# Patient Record
Sex: Female | Born: 1980 | Race: White | Hispanic: No | Marital: Married | Smoking: Never smoker
Health system: Southern US, Community
[De-identification: ages and names within clinical notes are randomized; demographics above are authoritative.]

## PROBLEM LIST (undated history)

## (undated) ENCOUNTER — Inpatient Hospital Stay (HOSPITAL_COMMUNITY): Payer: Self-pay

## (undated) DIAGNOSIS — Z8619 Personal history of other infectious and parasitic diseases: Secondary | ICD-10-CM

## (undated) DIAGNOSIS — B977 Papillomavirus as the cause of diseases classified elsewhere: Secondary | ICD-10-CM

## (undated) HISTORY — DX: Personal history of other infectious and parasitic diseases: Z86.19

## (undated) HISTORY — PX: COLPOSCOPY: SHX161

## (undated) HISTORY — PX: NO PAST SURGERIES: SHX2092

---

## 2012-09-19 ENCOUNTER — Other Ambulatory Visit (HOSPITAL_COMMUNITY)
Admission: RE | Admit: 2012-09-19 | Discharge: 2012-09-19 | Disposition: A | Discharge status: Home / Self Care | Payer: BC Managed Care – PPO | Source: Ambulatory Visit | Attending: Family Medicine | Admitting: Family Medicine

## 2012-09-19 DIAGNOSIS — Z1151 Encounter for screening for human papillomavirus (HPV): Secondary | ICD-10-CM | POA: Insufficient documentation

## 2012-09-19 DIAGNOSIS — Z124 Encounter for screening for malignant neoplasm of cervix: Secondary | ICD-10-CM | POA: Insufficient documentation

## 2012-10-02 ENCOUNTER — Inpatient Hospital Stay (HOSPITAL_COMMUNITY): Payer: BC Managed Care – PPO

## 2012-10-02 ENCOUNTER — Inpatient Hospital Stay (HOSPITAL_COMMUNITY)
Admission: AD | Admit: 2012-10-02 | Discharge: 2012-10-02 | Disposition: A | Discharge status: Home / Self Care | Payer: BC Managed Care – PPO | Source: Ambulatory Visit | Attending: Obstetrics & Gynecology | Admitting: Obstetrics & Gynecology

## 2012-10-02 ENCOUNTER — Encounter (HOSPITAL_COMMUNITY): Payer: Self-pay | Admitting: *Deleted

## 2012-10-02 DIAGNOSIS — O200 Threatened abortion: Secondary | ICD-10-CM | POA: Insufficient documentation

## 2012-10-02 DIAGNOSIS — R109 Unspecified abdominal pain: Secondary | ICD-10-CM | POA: Insufficient documentation

## 2012-10-02 LAB — URINALYSIS, ROUTINE W REFLEX MICROSCOPIC
Bilirubin Urine: NEGATIVE
Ketones, ur: NEGATIVE mg/dL
Nitrite: NEGATIVE
pH: 7 (ref 5.0–8.0)

## 2012-10-02 LAB — CBC
HCT: 41.4 % (ref 36.0–46.0)
Hemoglobin: 13.6 g/dL (ref 12.0–15.0)
MCH: 27.1 pg (ref 26.0–34.0)
MCV: 82.5 fL (ref 78.0–100.0)
Platelets: 192 10*3/uL (ref 150–400)
RBC: 5.02 MIL/uL (ref 3.87–5.11)

## 2012-10-02 LAB — WET PREP, GENITAL
Clue Cells Wet Prep HPF POC: NONE SEEN
Trich, Wet Prep: NONE SEEN
Yeast Wet Prep HPF POC: NONE SEEN

## 2012-10-02 NOTE — MAU Note (Signed)
Has been trying to get preg.  Period is 14days late.  Yesterday felt a little weird.  Yesterday had tannish d/c.  This morning woke up with very bad cramping. And now having bleeding, some clots when urinating.

## 2012-10-02 NOTE — MAU Provider Note (Signed)
History     CSN: [id number]  Arrival date and time: 10/02/12 1137   First Giovonni Poirier Initiated Contact with Patient 10/02/12 1313      Chief Complaint  Patient presents with  . Vaginal Bleeding  . Abdominal Pain  . Possible Pregnancy   HPI This is a 32 y.o. at 4+ weeks gestation who presents with c/o cramping and bleeding. Has been trying to conceive and this is a much-desired pregnancy. Bleeding started today.  OB History   Grav Para Term Preterm Abortions TAB SAB Ect Mult Living   1               No past medical history on file.  No past surgical history on file.  No family history on file.  History  Substance Use Topics  . Smoking status: Not on file  . Smokeless tobacco: Not on file  . Alcohol Use: Not on file    Allergies: Allergies not on file  No prescriptions prior to admission    Review of Systems  Constitutional: Negative for fever, chills and malaise/fatigue.  Gastrointestinal: Positive for abdominal pain. Negative for nausea, vomiting, diarrhea and constipation.  Genitourinary: Negative for dysuria.   Physical Exam   Blood pressure 107/64, pulse 92, temperature 98.1 F (36.7 C), temperature source Oral, resp. rate 18, height 5\' 8"  (1.727 m), weight 140 lb (63.504 kg), last menstrual period 08/21/2012.  Physical Exam  Constitutional: She is oriented to person, place, and time. She appears well-developed and well-nourished. No distress (but tearful).  Cardiovascular: Normal rate.   Respiratory: Effort normal.  GI: Soft. She exhibits no distension and no mass. There is tenderness (over uterus). There is no rebound and no guarding.  Genitourinary: Uterus normal. Vaginal discharge (moderate blood with clots) found.  Uterus small, 4 wk size, adnexae nontender   Musculoskeletal: Normal range of motion.  Neurological: She is alert and oriented to person, place, and time.  Skin: Skin is warm and dry.  Psychiatric: She has a normal mood and affect.    Results for orders placed during the hospital encounter of 10/02/12 (from the past 24 hour(s))  URINALYSIS, ROUTINE W REFLEX MICROSCOPIC     Status: Abnormal   Collection Time    10/02/12 12:21 PM      Result Value Range   Color, Urine YELLOW  YELLOW   APPearance CLEAR  CLEAR   Specific Gravity, Urine 1.010  1.005 - 1.030   pH 7.0  5.0 - 8.0   Glucose, UA NEGATIVE  NEGATIVE mg/dL   Hgb urine dipstick SMALL (*) NEGATIVE   Bilirubin Urine NEGATIVE  NEGATIVE   Ketones, ur NEGATIVE  NEGATIVE mg/dL   Protein, ur NEGATIVE  NEGATIVE mg/dL   Urobilinogen, UA 0.2  0.0 - 1.0 mg/dL   Nitrite NEGATIVE  NEGATIVE   Leukocytes, UA NEGATIVE  NEGATIVE  URINE MICROSCOPIC-ADD ON     Status: Abnormal   Collection Time    10/02/12 12:21 PM      Result Value Range   Squamous Epithelial / LPF FEW (*) RARE  POCT PREGNANCY, URINE     Status: Abnormal   Collection Time    10/02/12 12:30 PM      Result Value Range   Preg Test, Ur POSITIVE (*) NEGATIVE  CBC     Status: None   Collection Time    10/02/12 12:38 PM      Result Value Range   WBC 8.2  4.0 - 10.5 K/uL   RBC 5.02  3.87 - 5.11 MIL/uL   Hemoglobin 13.6  12.0 - 15.0 g/dL   HCT 40.9  81.1 - 91.4 %   MCV 82.5  78.0 - 100.0 fL   MCH 27.1  26.0 - 34.0 pg   MCHC 32.9  30.0 - 36.0 g/dL   RDW 78.2  95.6 - 21.3 %   Platelets 192  150 - 400 K/uL  HCG, QUANTITATIVE, PREGNANCY     Status: Abnormal   Collection Time    10/02/12 12:38 PM      Result Value Range   hCG, Beta Chain, Quant, S 232 (*) <5 mIU/mL  WET PREP, GENITAL     Status: Abnormal   Collection Time    10/02/12  1:20 PM      Result Value Range   Yeast Wet Prep HPF POC NONE SEEN  NONE SEEN   Trich, Wet Prep NONE SEEN  NONE SEEN   Clue Cells Wet Prep HPF POC NONE SEEN  NONE SEEN   WBC, Wet Prep HPF POC FEW (*) NONE SEEN    MAU Course  Procedures  MDM Quant, cultures and [place] ordered  Assessment and Plan  A:  SIUP at [redacted]w[redacted]d by LMP, 5 weeks by gestational sac      Threatened  Abortion, yolk sac seen  P:  Discharge home       Repeat quant in 48 hrs then [place] in a week     I told her she can leave after her blood draw Friday and we will call her with results  [facility] 10/02/2012, 1:26 PM

## 2012-10-02 NOTE — MAU Note (Signed)
Name and DOB verified. Pt confirmed spelling on IDband is correct.

## 2012-10-03 LAB — GC/CHLAMYDIA PROBE AMP
CT Probe RNA: NEGATIVE
GC Probe RNA: NEGATIVE

## 2012-10-03 NOTE — MAU Provider Note (Signed)

## 2012-10-05 ENCOUNTER — Inpatient Hospital Stay (HOSPITAL_COMMUNITY)
Admission: AD | Admit: 2012-10-05 | Discharge: 2012-10-05 | Disposition: A | Discharge status: Home / Self Care | Payer: BC Managed Care – PPO | Source: Ambulatory Visit | Attending: Obstetrics & Gynecology | Admitting: Obstetrics & Gynecology

## 2012-10-05 DIAGNOSIS — O021 Missed abortion: Secondary | ICD-10-CM | POA: Insufficient documentation

## 2012-10-05 NOTE — MAU Note (Signed)
Pt presents for repeat BHCG level.

## 2012-10-05 NOTE — MAU Provider Note (Signed)
  History     CSN: [id number]  Arrival date and time: 10/05/12 1810   None     No chief complaint on file.  HPI  Pt here for f/u Beta HCG.  Pt had Beta HCG of 232 on 3/5 when she presented with bleeding in pregnancy.  Pt states that she is still bleeding and had bled more the day after she was here with a clot.  Pt is not having pain today.  Pt was told she could have her HCG drawn and could leave and we would call her with results  No past medical history on file.  No past surgical history on file.  No family history on file.  History  Substance Use Topics  . Smoking status: Never Smoker   . Smokeless tobacco: Never Used  . Alcohol Use: No    Allergies: No Known Allergies  Prescriptions prior to admission  Medication Sig Dispense Refill  . Prenatal Vit-Fe Fumarate-FA (PRENATAL MULTIVITAMIN) TABS Take 1 tablet by mouth daily at 12 noon.        ROS Physical Exam   Last menstrual period 08/21/2012.  Physical Exam  Vitals reviewed. Constitutional: She is oriented to person, place, and time. She appears well-developed and well-nourished. No distress.  HENT:  Head: Normocephalic.  Eyes: Pupils are equal, round, and reactive to light.  Neck: Normal range of motion. Neck supple.  Cardiovascular: Normal rate.   Respiratory: Effort normal.  Musculoskeletal: Normal range of motion.  Neurological: She is alert and oriented to person, place, and time.  Skin: Skin is warm and dry.  Psychiatric: She has a normal mood and affect.    MAU Course  Procedures Results for orders placed during the hospital encounter of 10/05/12 (from the past 24 hour(s))  HCG, QUANTITATIVE, PREGNANCY     Status: Abnormal   Collection Time    10/05/12  6:23 PM      Result Value Range   hCG, Beta Chain, Quant, S 25 (*) <5 mIU/mL    Assessment and Plan  Anembryonic pregnancy Results reported to pt by phone Will f/u in 1 week for repeat [facility] and discuss failed pregnancy and recommendation  for future pregnancy- advised to wait 2 to 3 cycles  LINEBERRY,SUSAN 10/05/2012, 6:24 PM

## 2012-10-07 NOTE — MAU Provider Note (Signed)
Attestation of Attending Supervision of Advanced Practitioner (CNM/NP): Evaluation and management procedures were performed by the Advanced Practitioner under my supervision and collaboration.  I have reviewed the Advanced Practitioner's note and chart, and I agree with the management and plan.  Tecla Mailloux 10/07/2012 1:20 PM

## 2012-10-12 ENCOUNTER — Encounter (HOSPITAL_COMMUNITY): Payer: Self-pay | Admitting: Advanced Practice Midwife

## 2012-10-12 ENCOUNTER — Inpatient Hospital Stay (HOSPITAL_COMMUNITY)
Admission: AD | Admit: 2012-10-12 | Discharge: 2012-10-12 | Disposition: A | Discharge status: Home / Self Care | Payer: BC Managed Care – PPO | Source: Ambulatory Visit | Attending: Family Medicine | Admitting: Family Medicine

## 2012-10-12 DIAGNOSIS — O039 Complete or unspecified spontaneous abortion without complication: Secondary | ICD-10-CM | POA: Insufficient documentation

## 2012-10-12 NOTE — MAU Note (Signed)
Ms. Komar is here for repeat HCG. She is requesting to leave and be called with the results.

## 2012-10-12 NOTE — MAU Provider Note (Signed)
Christine Moyer is a 32 y.o. G2P1011 here for f/u quant s/p SAB. Seen in MAU on 3/6 with + UPT and bleeding, quant 232 at that time, repeat quant 2 days later was 25. Today has no bleeding or pain, states she thinks she passed a fetus in a sac on Monday.   Results for orders placed during the hospital encounter of 10/12/12 (from the past 24 hour(s))  HCG, QUANTITATIVE, PREGNANCY     Status: None   Collection Time    10/12/12 11:18 AM      Result Value Range   hCG, Beta Chain, Quant, S 2  <5 mIU/mL   A/P: Complete SAB Answered questions about when to resume efforts to conceive - recommended waiting at least one full menstrual cycle Follow up PRN

## 2012-10-12 NOTE — MAU Note (Addendum)
Pt is certain that she passed a "fetus" on this past Monday. She is certain that it was in a sack. Pt had some questions about when she could try and conceive again.

## 2012-10-13 NOTE — MAU Provider Note (Signed)
Chart reviewed and agree with management and plan.  

## 2013-04-15 ENCOUNTER — Inpatient Hospital Stay (HOSPITAL_COMMUNITY): Payer: BC Managed Care – PPO

## 2013-04-15 ENCOUNTER — Inpatient Hospital Stay (HOSPITAL_COMMUNITY)
Admission: AD | Admit: 2013-04-15 | Discharge: 2013-04-15 | Disposition: A | Discharge status: Home / Self Care | Payer: BC Managed Care – PPO | Source: Ambulatory Visit | Attending: Obstetrics & Gynecology | Admitting: Obstetrics & Gynecology

## 2013-04-15 ENCOUNTER — Encounter (HOSPITAL_COMMUNITY): Payer: Self-pay | Admitting: *Deleted

## 2013-04-15 DIAGNOSIS — O209 Hemorrhage in early pregnancy, unspecified: Secondary | ICD-10-CM | POA: Insufficient documentation

## 2013-04-15 DIAGNOSIS — N949 Unspecified condition associated with female genital organs and menstrual cycle: Secondary | ICD-10-CM | POA: Insufficient documentation

## 2013-04-15 DIAGNOSIS — R109 Unspecified abdominal pain: Secondary | ICD-10-CM | POA: Insufficient documentation

## 2013-04-15 DIAGNOSIS — Z3201 Encounter for pregnancy test, result positive: Secondary | ICD-10-CM

## 2013-04-15 LAB — URINALYSIS, ROUTINE W REFLEX MICROSCOPIC
Bilirubin Urine: NEGATIVE
Glucose, UA: NEGATIVE mg/dL
Ketones, ur: NEGATIVE mg/dL
Specific Gravity, Urine: <1.005 — ABNORMAL LOW (ref 1.005–1.030)
pH: 7 (ref 5.0–8.0)

## 2013-04-15 LAB — URINE MICROSCOPIC-ADD ON

## 2013-04-15 LAB — HCG, QUANTITATIVE, PREGNANCY: hCG, Beta Chain, Quant, S: 5078 m[IU]/mL — ABNORMAL HIGH (ref <5)

## 2013-04-15 LAB — CBC
Hemoglobin: 13 g/dL (ref 12.0–15.0)
MCHC: 33.4 g/dL (ref 30.0–36.0)
Platelets: 172 10*3/uL (ref 150–400)
RBC: 4.67 MIL/uL (ref 3.87–5.11)

## 2013-04-15 NOTE — MAU Provider Note (Signed)
History     CSN: [id number]  Arrival date and time: 04/15/13 4010   First Provider Initiated Contact with Patient 04/15/13 1231      Chief Complaint  Patient presents with  . Vaginal Bleeding  . Abdominal Pain   HPI Christine Moyer is a G3P1011 @ 5 weeks by LMP patient presenting with vaginal bleeding and pelvic pain. Her bleeding began on 04/11/13 and has persisted thru today. It is less than a period, but includes occasional clots. Her pelvic pain began this AM ([id number]) and was described as "crampy" similar to prior pregnancy cramps. She has had no PNC yet, denies trauma and recent illness.  She has hx of miscarriage in March 2014.   Past Medical History  Diagnosis Date  . Medical history non-contributory     Past Surgical History  Procedure Laterality Date  . No past surgeries      History reviewed. No pertinent family history.  History  Substance Use Topics  . Smoking status: Never Smoker   . Smokeless tobacco: Never Used  . Alcohol Use: No    Allergies: No Known Allergies  No prescriptions prior to admission    Review of Systems  Constitutional: Negative for fever, chills and malaise/fatigue.  Gastrointestinal: Positive for abdominal pain (pelvic "crampy" pain). Negative for nausea, vomiting, diarrhea and constipation.  Genitourinary: Negative for dysuria.       Denies vaginal D/C  Musculoskeletal: Negative for back pain.  Neurological: Positive for dizziness (very minor dizzyness, attributed to not eating much).   Physical Exam   Blood pressure 102/67, pulse 78, temperature 98.3 F (36.8 C), temperature source Oral, resp. rate 18, weight 66.316 kg (146 lb 3.2 oz), last menstrual period 01/25/2013, unknown if currently breastfeeding.  Physical Exam  Constitutional: She appears well-developed and well-nourished. No distress.  HENT:  Head: Normocephalic and atraumatic.  Eyes: Conjunctivae are normal.  Cardiovascular: Normal rate, regular rhythm, normal  heart sounds and intact distal pulses.  Exam reveals no gallop and no friction rub.   No murmur heard. GI: Soft. Bowel sounds are normal. She exhibits no distension. There is tenderness (Minor TTP in LLQ).  Skin: Skin is warm and dry. She is not diaphoretic.  Pelvic exam declined today  MAU Course  Procedures Results for orders placed during the hospital encounter of 04/15/13 (from the past 24 hour(s))  CBC     Status: None   Collection Time    04/15/13 12:41 PM      Result Value Range   WBC 10.4  4.0 - 10.5 K/uL   RBC 4.67  3.87 - 5.11 MIL/uL   Hemoglobin 13.0  12.0 - 15.0 g/dL   HCT 40.3  47.4 - 25.9 %   MCV 83.3  78.0 - 100.0 fL   MCH 27.8  26.0 - 34.0 pg   MCHC 33.4  30.0 - 36.0 g/dL   RDW 56.3  87.5 - 64.3 %   Platelets 172  150 - 400 K/uL  HCG, QUANTITATIVE, PREGNANCY     Status: Abnormal   Collection Time    04/15/13 12:41 PM      Result Value Range   hCG, Beta Chain, Quant, S 5078 (*) <5 mIU/mL   US Ob Comp Less 14 Wks  04/15/2013   CLINICAL DATA:  Pregnant, bleeding, beta HCG pending  EXAM: OBSTETRIC <14 WK [place] AND TRANSVAGINAL OB US  TECHNIQUE: Both transabdominal and transvaginal ultrasound examinations were performed for complete evaluation of the gestation as well as the maternal  uterus, adnexal regions, and pelvic cul-de-sac. Transvaginal technique was performed to assess early pregnancy.  COMPARISON:  None.  FINDINGS: Intrauterine gestational sac:  Not visualized  Maternal uterus/adnexae: Endometrium is thickened/heterogeneous, measuring 2.6 cm in thickness.  Right ovary are within normal limits, measuring 2.4 x 1.8 x 2.1 cm.  Left ovary is only visualized transabdominally but is grossly unremarkable, measuring 2.6 x 1.5 x 1.5 cm.  Fluid/irregular cystic lesion within the vagina.  Trace free fluid.  IMPRESSION: No intrauterine gestation is visualized. No adnexal mass is seen. Correlate with beta HCG (pending).  Endometrial complex is thickened/heterogeneous, measuring 2.6  cm.  Fluid/irregular cystic lesion within the vagina. Clinical correlation required.   Electronically Signed   By: Charline Bills M.D.   On: 04/15/2013 14:01   US Ob Transvaginal  04/15/2013   CLINICAL DATA:  Pregnant, bleeding, beta HCG pending  EXAM: OBSTETRIC <14 WK [place] AND TRANSVAGINAL OB US  TECHNIQUE: Both transabdominal and transvaginal ultrasound examinations were performed for complete evaluation of the gestation as well as the maternal uterus, adnexal regions, and pelvic cul-de-sac. Transvaginal technique was performed to assess early pregnancy.  COMPARISON:  None.  FINDINGS: Intrauterine gestational sac:  Not visualized  Maternal uterus/adnexae: Endometrium is thickened/heterogeneous, measuring 2.6 cm in thickness.  Right ovary are within normal limits, measuring 2.4 x 1.8 x 2.1 cm.  Left ovary is only visualized transabdominally but is grossly unremarkable, measuring 2.6 x 1.5 x 1.5 cm.  Fluid/irregular cystic lesion within the vagina.  Trace free fluid.  IMPRESSION: No intrauterine gestation is visualized. No adnexal mass is seen. Correlate with beta HCG (pending).  Endometrial complex is thickened/heterogeneous, measuring 2.6 cm.  Fluid/irregular cystic lesion within the vagina. Clinical correlation required.   Electronically Signed   By: Charline Bills M.D.   On: 04/15/2013 14:01    MDM HCG pending. [place] doesn't show IUP or Ectopic. Considering estimated GA of [redacted] weeks alongside [place], miscarriage is very possible, but cannot be diagnosed at this time. Further comparisons of hCG levels needed.   Assessment and Plan  #Positive pregnancy test  #Bleeding during pregnancy (1st trimester) -Ectopic precautions given.  Return to MAU in 48 hours for repeat quant hcg -Appt made with [facility] on 04/29/13 to reevaluate quant hcg if needed, and for frequent miscarriage and vaginal cystic lesion found on [place] -Return to MAU sooner as needed  LEFTWICH-KIRBY, Amilah Greenspan 04/16/2013, 11:54 AM   I have  seen this patient and agree with the above PA student's note.  LEFTWICH-KIRBY, Colm Lyford Certified Nurse-Midwife

## 2013-04-15 NOTE — MAU Note (Signed)
Miscarriage in March, started bleeding last Friday - dark brownish, today having lower abd pain, bleeding again today, clots.  Pt is late for her period, thinks she is pregnant.

## 2013-04-21 NOTE — MAU Provider Note (Signed)
Attestation of Attending Supervision of Advanced Practitioner (CNM/NP): Evaluation and management procedures were performed by the Advanced Practitioner under my supervision and collaboration. I have reviewed the Advanced Practitioner's note and chart, and I agree with the management and plan.  Iline Buchinger H. 7:47 AM

## 2013-04-29 ENCOUNTER — Encounter: Payer: BC Managed Care – PPO | Admitting: Obstetrics & Gynecology

## 2013-05-05 ENCOUNTER — Encounter: Payer: BC Managed Care – PPO | Admitting: Obstetrics & Gynecology

## 2013-07-23 ENCOUNTER — Ambulatory Visit: Payer: BC Managed Care – PPO | Admitting: Family Medicine

## 2013-07-31 NOTE — L&D Delivery Note (Signed)
Delivery Note At 11:54 AM a viable female was delivered via Vaginal, Spontaneous Delivery (Presentation: ; Occiput Anterior).  APGAR: 7, 8; weight .   Placenta status: Intact, Spontaneous.  Cord: 3 vessels with the following complications: None.  Cord pH: n/a Tight nuchal cord, surgically reduced at the perineum.  Anesthesia: None  Episiotomy:  Lacerations: 2nd degree;Perineal Suture Repair: 2.0 3.0 chromic, both repaired Est. Blood Loss (mL): 250  Mom to postpartum.  Baby to Couplet care / Skin to Skin.  Geryl RankinsVARNADO, Christine Hinton 05/19/2014, 12:24 PM

## 2013-08-11 ENCOUNTER — Ambulatory Visit: Payer: BC Managed Care – PPO | Admitting: Family Medicine

## 2013-08-28 LAB — LIPID PANEL
Cholesterol: 163 mg/dL (ref 0–200)
HDL: 71 mg/dL — AB (ref 35–70)
LDL (calc): 73
Triglycerides: 100

## 2013-08-28 LAB — GLUCOSE, FASTING: Glucose: 80

## 2013-10-09 ENCOUNTER — Encounter: Payer: Self-pay | Admitting: Family Medicine

## 2013-10-09 ENCOUNTER — Other Ambulatory Visit: Payer: Self-pay | Admitting: Obstetrics & Gynecology

## 2013-10-09 ENCOUNTER — Encounter: Payer: Self-pay | Admitting: *Deleted

## 2013-10-09 ENCOUNTER — Ambulatory Visit (INDEPENDENT_AMBULATORY_CARE_PROVIDER_SITE_OTHER): Payer: BC Managed Care – PPO | Admitting: Family Medicine

## 2013-10-09 ENCOUNTER — Other Ambulatory Visit (HOSPITAL_COMMUNITY)
Admission: RE | Admit: 2013-10-09 | Discharge: 2013-10-09 | Disposition: A | Discharge status: Home / Self Care | Payer: BC Managed Care – PPO | Source: Ambulatory Visit | Attending: Obstetrics & Gynecology | Admitting: Obstetrics & Gynecology

## 2013-10-09 VITALS — BP 102/68 | HR 88 | Temp 98.7°F | Ht 68.5 in | Wt 149.6 lb

## 2013-10-09 DIAGNOSIS — Z1151 Encounter for screening for human papillomavirus (HPV): Secondary | ICD-10-CM | POA: Insufficient documentation

## 2013-10-09 DIAGNOSIS — Z113 Encounter for screening for infections with a predominantly sexual mode of transmission: Secondary | ICD-10-CM | POA: Insufficient documentation

## 2013-10-09 DIAGNOSIS — Z0000 Encounter for general adult medical examination without abnormal findings: Secondary | ICD-10-CM

## 2013-10-09 DIAGNOSIS — Z01419 Encounter for gynecological examination (general) (routine) without abnormal findings: Secondary | ICD-10-CM | POA: Insufficient documentation

## 2013-10-09 NOTE — Assessment & Plan Note (Signed)
Preventative protocols reviewed and updated unless pt declined. Discussed healthy diet and lifestyle.  Currently pregnant - congratulated and encouraged routine f/u with OBGYN for prenatal care.

## 2013-10-09 NOTE — Progress Notes (Signed)
BP 102/68  Pulse 88  Temp(Src) 98.7 F (37.1 C) (Oral)  Ht 5' 8.5" (1.74 m)  Wt 149 lb 9.6 oz (67.858 kg)  BMI 22.41 kg/m2  LMP 01/25/2013   CC: new pt to establish  Subjective:    Patient ID: Christine Moyer, female    DOB: 1981-01-04, 33 y.o.   MRN: [id number]  HPI: Christine Moyer is a 33 y.o. female presenting on 10/09/2013 with Establish Care   Pregnant - [facility] 04/2013. Some constipation.  Trying to increase water and fiber in diet.  Discussed soluble fiber supplement. Getting over cold vs allergies.  Seat belt use discussed. Sunscreen use discussed.  Denies sunburn in last year.  Preventative: Last CPE last year Normal blood work. Last pap 07/2012 WNL. Currently pregnant - OBGYN Dr. Era Bumpers [facility] G4P1 Flu shot - did not have Tetanus - unsure - to have during pregnancy.  Lives with husband and daughter and cat Occupation: Furniture conservator/restorer Edu: Masters in [employer] Activity: gym/yoga 1-2 x/wk Diet: good water, fruits/vegetables daily  Relevant past medical, surgical, family and social history reviewed and updated as indicated.  Allergies and medications reviewed and updated. Current Outpatient Prescriptions on File Prior to Visit  Medication Sig  . Prenatal Vit-Fe Fumarate-FA (PRENATAL MULTIVITAMIN) TABS Take 1 tablet by mouth daily at 12 noon.   No current facility-administered medications on file prior to visit.    Review of Systems  Constitutional: Negative for fever, chills, activity change, appetite change, fatigue and unexpected weight change (weight gain from pregnancy).  HENT: Negative for hearing loss.   Eyes: Negative for visual disturbance.  Respiratory: Negative for cough, chest tightness, shortness of breath and wheezing.   Cardiovascular: Negative for chest pain, palpitations and leg swelling.  Gastrointestinal: Positive for constipation. Negative for nausea, vomiting, abdominal pain, diarrhea, blood in stool and abdominal  distention.  Genitourinary: Negative for hematuria and difficulty urinating.  Musculoskeletal: Negative for arthralgias, myalgias and neck pain.  Skin: Negative for rash.  Neurological: Negative for dizziness, seizures, syncope and headaches.  Hematological: Negative for adenopathy. Does not bruise/bleed easily.  Psychiatric/Behavioral: Negative for dysphoric mood. The patient is not nervous/anxious.    Per HPI unless specifically indicated above    Objective:    BP 102/68  Pulse 88  Temp(Src) 98.7 F (37.1 C) (Oral)  Ht 5' 8.5" (1.74 m)  Wt 149 lb 9.6 oz (67.858 kg)  BMI 22.41 kg/m2  LMP 01/25/2013  Physical Exam  Nursing note and vitals reviewed. Constitutional: She is oriented to person, place, and time. She appears well-developed and well-nourished. No distress.  HENT:  Head: Normocephalic and atraumatic.  Right Ear: Hearing, tympanic membrane, external ear and ear canal normal.  Left Ear: Hearing, tympanic membrane, external ear and ear canal normal.  Nose: Nose normal.  Mouth/Throat: Uvula is midline, oropharynx is clear and moist and mucous membranes are normal. No oropharyngeal exudate, posterior oropharyngeal edema or posterior oropharyngeal erythema.  Eyes: Conjunctivae and EOM are normal. Pupils are equal, round, and reactive to light. No scleral icterus.  Neck: Normal range of motion. Neck supple.  Cardiovascular: Normal rate, regular rhythm, normal heart sounds and intact distal pulses.   No murmur heard. Pulses:      Radial pulses are 2+ on the right side, and 2+ on the left side.  Pulmonary/Chest: Effort normal and breath sounds normal. No respiratory distress. She has no wheezes. She has no rales.  Abdominal: Soft. Bowel sounds are normal. She exhibits no distension and no  mass. There is no tenderness. There is no rebound and no guarding.  Musculoskeletal: Normal range of motion. She exhibits no edema.  Lymphadenopathy:    She has no cervical adenopathy.    Neurological: She is alert and oriented to person, place, and time.  CN grossly intact, station and gait intact  Skin: Skin is warm and dry. No rash noted.  Psychiatric: She has a normal mood and affect. Her behavior is normal. Judgment and thought content normal.       Assessment & Plan:   Problem List Items Addressed This Visit   Health care maintenance - Primary     Preventative protocols reviewed and updated unless pt declined. Discussed healthy diet and lifestyle.  Currently pregnant - congratulated and encouraged routine f/u with OBGYN for prenatal care.        Follow up plan: Return in about 1 year (around 10/10/2014), or as needed, for annual exam, prior fasting for blood work.

## 2013-10-09 NOTE — Patient Instructions (Signed)
Good to see you today, call us with questions. Let me know if you need any forms signed for work. Return as needed or in 1 year for next physical.

## 2013-10-09 NOTE — Progress Notes (Signed)
Pre visit review using our clinic review tool, if applicable. No additional management support is needed unless otherwise documented below in the visit note. 

## 2013-12-23 ENCOUNTER — Encounter: Payer: Self-pay | Admitting: Family Medicine

## 2014-05-19 ENCOUNTER — Telehealth: Payer: Self-pay

## 2014-05-19 ENCOUNTER — Inpatient Hospital Stay (HOSPITAL_COMMUNITY)
Admission: AD | Admit: 2014-05-19 | Discharge: 2014-05-21 | DRG: 775 | Disposition: A | Discharge status: Home / Self Care | Payer: BC Managed Care – PPO | Source: Ambulatory Visit | Attending: Obstetrics & Gynecology | Admitting: Obstetrics & Gynecology

## 2014-05-19 ENCOUNTER — Encounter (HOSPITAL_COMMUNITY): Payer: Self-pay | Admitting: *Deleted

## 2014-05-19 DIAGNOSIS — O471 False labor at or after 37 completed weeks of gestation: Secondary | ICD-10-CM | POA: Diagnosis present

## 2014-05-19 DIAGNOSIS — Z3A39 39 weeks gestation of pregnancy: Secondary | ICD-10-CM | POA: Diagnosis present

## 2014-05-19 HISTORY — DX: Papillomavirus as the cause of diseases classified elsewhere: B97.7

## 2014-05-19 LAB — CBC
HCT: 39.4 % (ref 36.0–46.0)
Hemoglobin: 13.6 g/dL (ref 12.0–15.0)
MCH: 30 pg (ref 26.0–34.0)
MCHC: 34.5 g/dL (ref 30.0–36.0)
MCV: 86.8 fL (ref 78.0–100.0)
PLATELETS: 197 10*3/uL (ref 150–400)
RBC: 4.54 MIL/uL (ref 3.87–5.11)
RDW: 12.5 % (ref 11.5–15.5)
WBC: 14.7 10*3/uL — ABNORMAL HIGH (ref 4.0–10.5)

## 2014-05-19 LAB — OB RESULTS CONSOLE ABO/RH: RH Type: POSITIVE

## 2014-05-19 LAB — OB RESULTS CONSOLE ANTIBODY SCREEN: Antibody Screen: NEGATIVE

## 2014-05-19 LAB — OB RESULTS CONSOLE GBS: STREP GROUP B AG: NEGATIVE

## 2014-05-19 LAB — RPR

## 2014-05-19 LAB — OB RESULTS CONSOLE HIV ANTIBODY (ROUTINE TESTING): HIV: NONREACTIVE

## 2014-05-19 LAB — OB RESULTS CONSOLE HEPATITIS B SURFACE ANTIGEN: HEP B S AG: NEGATIVE

## 2014-05-19 LAB — OB RESULTS CONSOLE RUBELLA ANTIBODY, IGM: RUBELLA: IMMUNE

## 2014-05-19 LAB — OB RESULTS CONSOLE RPR: RPR: NONREACTIVE

## 2014-05-19 MED ORDER — OXYTOCIN 40 UNITS IN LACTATED RINGERS INFUSION - SIMPLE MED: 62.5000 mL/h | INTRAVENOUS | Status: DC | PRN

## 2014-05-19 MED ORDER — LACTATED RINGERS IV SOLN: 500.0000 mL | INTRAVENOUS | Status: DC | PRN

## 2014-05-19 MED ORDER — SIMETHICONE 80 MG PO CHEW: 80.0000 mg | CHEWABLE_TABLET | ORAL | Status: DC | PRN

## 2014-05-19 MED ORDER — OXYCODONE-ACETAMINOPHEN 5-325 MG PO TABS: 2.0000 | ORAL_TABLET | ORAL | Status: DC | PRN

## 2014-05-19 MED ORDER — METHYLERGONOVINE MALEATE 0.2 MG PO TABS: 0.2000 mg | ORAL_TABLET | ORAL | Status: DC | PRN

## 2014-05-19 MED ORDER — ONDANSETRON HCL 4 MG/2ML IJ SOLN
4.0000 mg | Freq: Four times a day (QID) | INTRAMUSCULAR | Status: DC | PRN
  Administered 2014-05-19: 4 mg via INTRAVENOUS
  Filled 2014-05-19: qty 2

## 2014-05-19 MED ORDER — ONDANSETRON HCL 4 MG PO TABS: 4.0000 mg | ORAL_TABLET | ORAL | Status: DC | PRN

## 2014-05-19 MED ORDER — MAGNESIUM HYDROXIDE 400 MG/5ML PO SUSP: 30.0000 mL | ORAL | Status: DC | PRN

## 2014-05-19 MED ORDER — LANOLIN HYDROUS EX OINT: TOPICAL_OINTMENT | CUTANEOUS | Status: DC | PRN

## 2014-05-19 MED ORDER — ONDANSETRON HCL 4 MG/2ML IJ SOLN: 4.0000 mg | INTRAMUSCULAR | Status: DC | PRN

## 2014-05-19 MED ORDER — PRENATAL MULTIVITAMIN CH
1.0000 | ORAL_TABLET | Freq: Every day | ORAL | Status: DC
  Administered 2014-05-20: 1 via ORAL
  Filled 2014-05-19: qty 1

## 2014-05-19 MED ORDER — ZOLPIDEM TARTRATE 5 MG PO TABS: 5.0000 mg | ORAL_TABLET | Freq: Every evening | ORAL | Status: DC | PRN

## 2014-05-19 MED ORDER — WITCH HAZEL-GLYCERIN EX PADS: 1.0000 "application " | MEDICATED_PAD | CUTANEOUS | Status: DC | PRN

## 2014-05-19 MED ORDER — OXYTOCIN 40 UNITS IN LACTATED RINGERS INFUSION - SIMPLE MED
62.5000 mL/h | INTRAVENOUS | Status: DC
Start: 2014-05-19 — End: 2014-05-19
  Administered 2014-05-19: 999 mL/h via INTRAVENOUS
  Filled 2014-05-19: qty 1000

## 2014-05-19 MED ORDER — OXYTOCIN BOLUS FROM INFUSION: 500.0000 mL | INTRAVENOUS | Status: DC

## 2014-05-19 MED ORDER — DIBUCAINE 1 % RE OINT
1.0000 "application " | TOPICAL_OINTMENT | RECTAL | Status: DC | PRN
  Filled 2014-05-19: qty 28

## 2014-05-19 MED ORDER — LACTATED RINGERS IV SOLN
INTRAVENOUS | Status: DC
Start: 2014-05-19 — End: 2014-05-19
  Administered 2014-05-19: 08:00:00 via INTRAVENOUS

## 2014-05-19 MED ORDER — OXYTOCIN 40 UNITS IN LACTATED RINGERS INFUSION - SIMPLE MED: 1.0000 m[IU]/min | INTRAVENOUS | Status: DC

## 2014-05-19 MED ORDER — LIDOCAINE HCL (PF) 1 % IJ SOLN
30.0000 mL | INTRAMUSCULAR | Status: AC | PRN
  Administered 2014-05-19: 30 mL via SUBCUTANEOUS
  Filled 2014-05-19: qty 30

## 2014-05-19 MED ORDER — FLEET ENEMA 7-19 GM/118ML RE ENEM: 1.0000 | ENEMA | RECTAL | Status: DC | PRN

## 2014-05-19 MED ORDER — OXYCODONE-ACETAMINOPHEN 5-325 MG PO TABS: 1.0000 | ORAL_TABLET | ORAL | Status: DC | PRN

## 2014-05-19 MED ORDER — ACETAMINOPHEN 325 MG PO TABS: 650.0000 mg | ORAL_TABLET | ORAL | Status: DC | PRN

## 2014-05-19 MED ORDER — CITRIC ACID-SODIUM CITRATE 334-500 MG/5ML PO SOLN: 30.0000 mL | ORAL | Status: DC | PRN

## 2014-05-19 MED ORDER — DIPHENHYDRAMINE HCL 25 MG PO CAPS: 25.0000 mg | ORAL_CAPSULE | Freq: Four times a day (QID) | ORAL | Status: DC | PRN

## 2014-05-19 MED ORDER — TERBUTALINE SULFATE 1 MG/ML IJ SOLN: 0.2500 mg | Freq: Once | INTRAMUSCULAR | Status: DC | PRN

## 2014-05-19 MED ORDER — IBUPROFEN 600 MG PO TABS
600.0000 mg | ORAL_TABLET | Freq: Four times a day (QID) | ORAL | Status: DC
  Administered 2014-05-19: 600 mg via ORAL
  Filled 2014-05-19 (×4): qty 1

## 2014-05-19 MED ORDER — SENNOSIDES-DOCUSATE SODIUM 8.6-50 MG PO TABS
2.0000 | ORAL_TABLET | ORAL | Status: DC
  Administered 2014-05-19 – 2014-05-20 (×2): 2 via ORAL
  Filled 2014-05-19 (×2): qty 2

## 2014-05-19 MED ORDER — BENZOCAINE-MENTHOL 20-0.5 % EX AERO
1.0000 "application " | INHALATION_SPRAY | CUTANEOUS | Status: DC | PRN
  Administered 2014-05-19: 1 via TOPICAL
  Filled 2014-05-19 (×2): qty 56

## 2014-05-19 MED ORDER — METHYLERGONOVINE MALEATE 0.2 MG/ML IJ SOLN: 0.2000 mg | INTRAMUSCULAR | Status: DC | PRN

## 2014-05-19 NOTE — Lactation Note (Signed)
This note was copied from the chart of Girl Clair GullingUliana Chagnon. Lactation Consultation Note  Patient Name: Girl Clair GullingUliana Rodas ZOXWR'UToday's Date: 05/19/2014 Reason for consult: Initial assessment of this mom and baby at 9 hours postpartum. Mom states she breastfed her older child for 1 year and denies hx of BF problems or any current BF concerns.  LC observed baby already well-latched to mom's (L) breast in cradle position with flanged lips and rhythmical sucking bursts.  Mom reports feeling strong tugs when baby sucks and was able to latch baby on her own.  LC encouraged frequent STS and cue feedings.  LC encouraged review of Baby and Me pp 9, 14 and 20-25 for STS and BF information. LC provided [employer] Resource brochure and reviewed [facility] and list of community and web site resources.    Maternal Data Formula Feeding for Exclusion: No Has patient been taught Hand Expression?: Yes (experienced mom and states she knows how to hand express her colostrum/milk) Does the patient have breastfeeding experience prior to this delivery?: Yes  Feeding Feeding Type: Breast Fed  LATCH Score/Interventions Latch: Grasps breast easily, tongue down, lips flanged, rhythmical sucking.  Audible Swallowing: A few with stimulation Intervention(s): Skin to skin  Type of Nipple: Everted at rest and after stimulation  Comfort (Breast/Nipple): Soft / non-tender     Hold (Positioning): No assistance needed to correctly position infant at breast.  LATCH Score: 9 (previous LATCH assessment by RN) but LC did observe baby well-latched at this visit; per mom, baby had been on breast >5 minutes  Lactation Tools Discussed/Used   STS, cue feedings, hand expression  Consult Status Consult Status: Follow-up Date: 05/20/14 Follow-up type: In-patient    Warrick ParisianBryant, Nyana [facility] 05/19/2014, 9:54 PM

## 2014-05-19 NOTE — MAU Note (Signed)
Pt reports contractions and leaking fluid since 0230

## 2014-05-19 NOTE — H&P (Signed)
Christine Moyer is a 33 y.o. female presenting for G5 P1021 @ 39 6/7 weeks presenting with c/o ROM. Clear fluid noted starting at 0230. Contractions started after rupture. Cervix was 1-2 cm in office 5 days ago. Pt is 3 cm on arrival. Pt declines IV pain medication or epidural. Pt denies complications with her last delivery. Pregnancy has been uncomplicated to date. GBS negative.    Reason for admission: Rupture of membranes.   Contractions: Onset was 3-5 hours ago.  Frequency: regular.  Perceived severity is moderate.  7 out of 10 pain.  Fetal activity: Perceived fetal activity is normal.  Prenatal complications: no prenatal complications Prenatal Complications - Diabetes: none.  OB History    Grav  Para  Term  Preterm  Abortions  TAB  SAB  Ect  Mult  Living    5  1  1   2   2    1       Past Medical History   Diagnosis  Date   .  History of chicken pox    .  HPV (human papilloma virus) infection     Past Surgical History   Procedure  Laterality  Date   .  No past surgeries     .  Colposcopy      Family History: family history includes Cancer (age of onset: 7583) in her maternal grandmother; Cirrhosis in her father. There is no history of Diabetes, CAD, Stroke, or Hypertension.  Social History: reports that she has never smoked. She has never used smokeless tobacco. She reports that she drinks alcohol. She reports that she does not use illicit drugs.     Maternal Diabetes: No  Genetic Screening: Normal  Maternal Ultrasounds/Referrals: Normal  Fetal Ultrasounds or other Referrals: None  Maternal Substance Abuse: No  Significant Maternal Medications: None  Significant Maternal Lab Results: Lab values include: Group B Strep negative  Other Comments: None  Review of Systems  Constitutional: Negative for fever.  Gastrointestinal: Positive for abdominal pain.  Dilation: 3.5  Effacement (%): 70  Station: -3  Exam by:: Dr Dion BodyVarnado  Blood pressure 109/60, pulse 80, temperature  98.1 F (36.7 C), temperature source Oral, resp. rate 18, height 5\' 10"  (1.778 m), weight 84.369 kg (186 lb), SpO2 100.00%, unknown if currently breastfeeding.  Maternal Exam:  Uterine Assessment: Contraction frequency is regular.  Abdomen: Patient reports no abdominal tenderness. Estimated fetal weight is 6.5-7 lbs.  Fetal presentation: vertex  Introitus: Normal vulva. Amniotic fluid character: clear.  Pelvis: adequate for delivery.  Cervix: Cervix evaluated by digital exam.  Fetal Exam  Fetal Monitor Review: Mode: fetoscope.  Variability: moderate (6-25 bpm).  Pattern: accelerations present and no decelerations.  Fetal State Assessment: Category I - tracings are normal.  Physical Exam  Constitutional: She is oriented to person, place, and time. She appears well-nourished. No distress.  HENT:  Head: Normocephalic and atraumatic.  Eyes: EOM are normal.  Cardiovascular: Normal rate, regular rhythm and normal heart sounds.  Respiratory: Effort normal and breath sounds normal. No respiratory distress.  GI: Soft.  Genitourinary: Vagina normal.  Musculoskeletal: She exhibits no tenderness.  Neurological: She is alert and oriented to person, place, and time.  Skin: Skin is warm and dry. She is not diaphoretic.  Psychiatric: She has a normal mood and affect.  Prenatal labs:  ABO, Rh: B/Positive/-- (10/20 0740)  Antibody: Negative (10/20 0740)  Rubella: Immune (10/20 0740)  RPR: Nonreactive (10/20 0740)  HBsAg: Negative (10/20 0740)  HIV: Non-reactive (10/20  0740)  GBS: Negative (10/20 0740)  Assessment/Plan:  IUP @ 39 6/7 weeks, Latent labor s/p SROM. Expectant management without medication or epidural. Augment with Pitocin if contractions space out or any signs of infection.  Geryl RankinsVARNADO, [place]  05/19/2014, 8:43 AM

## 2014-05-19 NOTE — Telephone Encounter (Signed)
Patient call reporting SROM at 0230.  Patient reports that she was sleeping and felt a "ping."  Upon going to restroom she was unable to stop urinating and has saturated one pad.  Patient reports active fetus and denies VB.  Patient reports contractions that are irregular, occurring every 15-20 mins, and last about 30 sec.  Patient desires "natural" labor and was "1.5cm" in the office on Thursday.  Patient informed that it was okay to remain at home for another hour in hopes that contractions become more regular and longer in duration.  Patient instructed to call back if contractions start to become regular (5-7 mins apart), within the next hour, and she desires to remain at home.  Otherwise, Patient instructed to report to the MAU for initiation of pitocin.  Patient verbalized understanding and states she will call answering service back by 0500.  No other questions or concerns.  JE, CNM

## 2014-05-20 LAB — CBC
HCT: 36.5 % (ref 36.0–46.0)
Hemoglobin: 12.6 g/dL (ref 12.0–15.0)
MCH: 30.4 pg (ref 26.0–34.0)
MCHC: 34.5 g/dL (ref 30.0–36.0)
MCV: 88 fL (ref 78.0–100.0)
PLATELETS: 179 10*3/uL (ref 150–400)
RBC: 4.15 MIL/uL (ref 3.87–5.11)
RDW: 12.7 % (ref 11.5–15.5)
WBC: 14.5 10*3/uL — ABNORMAL HIGH (ref 4.0–10.5)

## 2014-05-20 MED ORDER — IBUPROFEN 600 MG PO TABS: 600.0000 mg | ORAL_TABLET | Freq: Four times a day (QID) | ORAL | Status: DC

## 2014-05-20 NOTE — Plan of Care (Signed)
Problem: Discharge Progression Outcomes Goal: MMR given as ordered Outcome: Not Applicable Date Met:  16/10/96 Rubella immune

## 2014-05-20 NOTE — Discharge Instructions (Signed)
Vaginal Delivery, Care After Refer to this sheet in the next few weeks. These discharge instructions provide you with information on caring for yourself after delivery. Your caregiver may also give you specific instructions. Your treatment has been planned according to the most current medical practices available, but problems sometimes occur. Call your caregiver if you have any problems or questions after you go home. HOME CARE INSTRUCTIONS  Take over-the-counter or prescription medicines only as directed by your caregiver or pharmacist.  Do not drink alcohol, especially if you are breastfeeding or taking medicine to relieve pain.  Do not chew or smoke tobacco.  Do not use illegal drugs.  Continue to use good perineal care. Good perineal care includes:  Wiping your perineum from front to back.  Keeping your perineum clean.  Do not use tampons or douche for 6 weeks.  Nothing in the vagina for 6 weeks.  Shower when comfortable.  No tub bath for 4 weeks.   Wear a well-fitting bra that provides breast support.  Eat healthy foods.  Drink enough fluids to keep your urine clear or pale yellow.  Eat high-fiber foods such as whole grain cereals and breads, brown rice, beans, and fresh fruits and vegetables every day. These foods may help prevent or relieve constipation.  Follow your caregiver's recommendations regarding resumption of activities such as climbing stairs, driving, lifting, exercising, or traveling.  Talk to your caregiver about resuming sexual activities. Resumption of sexual activities is dependent upon your risk of infection, your rate of healing, and your comfort and desire to resume sexual activity.  Try to have someone help you with your household activities and your newborn for at least a few days after you leave the hospital.  Rest as much as possible. Try to rest or take a nap when your newborn is sleeping.  Increase your activities gradually.  Keep all of your  scheduled postpartum appointments. It is very important to keep your scheduled follow-up appointments. At these appointments, your caregiver will be checking to make sure that you are healing physically and emotionally. SEEK MEDICAL CARE IF:   You are passing large clots from your vagina. Save any clots to show your caregiver.  You have a foul smelling discharge from your vagina.  You have trouble urinating.  You are urinating frequently.  You have pain when you urinate.  You have a change in your bowel movements.  You have increasing redness, pain, or swelling near your vaginal incision (episiotomy) or vaginal tear.  You have pus draining from your episiotomy or vaginal tear.  Your episiotomy or vaginal tear is separating.  You have painful, hard, or reddened breasts.  You have a severe headache.  You have blurred vision or see spots.  You feel sad or depressed.  You have thoughts of hurting yourself or your newborn.  You have questions about your care, the care of your newborn, or medicines.  You are dizzy or light-headed.  You have a rash.  You have nausea or vomiting.  You were breastfeeding and have not had a menstrual period within 12 weeks after you stopped breastfeeding.  You are not breastfeeding and have not had a menstrual period by the 12th week after delivery.  You have a fever. SEEK IMMEDIATE MEDICAL CARE IF:   You have persistent pain.  You have chest pain.  You have shortness of breath.  You faint.  You have leg pain.  You have stomach pain.  Your vaginal bleeding saturates two or more  sanitary pads in 1 hour. MAKE SURE YOU:   Understand these instructions.  Will watch your condition.  Will get help right away if you are not doing well or get worse. Document Released: 07/14/2000 Document Revised: 12/01/2013 Document Reviewed: 03/13/2012 [facility] Patient Information 2015 [place], [place]. This information is not intended to replace  advice given to you by your health care provider. Make sure you discuss any questions you have with your health care provider.

## 2014-05-20 NOTE — Progress Notes (Signed)
Postpartum Note Day # 1  S:  Patient resting comfortable in bed.  Pain controlled.  Tolerating general. Lochia moderate.  Minimal ambulation.  She denies n/v/f/c, SOB, or CP.  Pt plans on breastfeeding.  O: Temp:  [98.1 F (36.7 C)-99.1 F (37.3 C)] 98.2 F (36.8 C) (10/21 0521) Pulse Rate:  [61-80] 71 (10/21 0521) Resp:  [18-20] 18 (10/21 0521) BP: (101-124)/(54-77) 103/61 mmHg (10/21 0521) SpO2:  [100 %] 100 % (10/20 1600) Gen: A&Ox3, NAD CV: RRR, no MRG Resp: CTAB Abdomen: soft, NT, ND Uterus: firm, non-tender, below umbilicus Ext: 1+ non-pitting edema, no calf tenderness bilaterally  Labs:  Recent Labs  05/19/14 0748 05/20/14 0540  HGB 13.6 12.6    A/P: Pt is a 33 y.o. [id number] s/p NSVD, PPD#1  - Pain well controlled -GU: UOP is adequate -GI: Tolerating general diet -Activity: encouraged sitting up to chair and ambulation as tolerated -Prophylaxis: early ambulation -Labs: stable as above  Dispo: Continue with routine postpartum care, plan for discharge home tomorrow.  Myna HidalgoJennifer Lashonta Pilling, DO [phone] (pager) [phone] (office)

## 2014-05-21 NOTE — Progress Notes (Signed)
Postpartum Note Day # 2  S:  Patient resting comfortable in bed.  Pain controlled.  Tolerating general. Lochia moderate.  +BM.  Ambulating without difficulty and voiding freely. She denies n/v/f/c, SOB, or CP.  Pt plans on breastfeeding.  O: Temp:  [98 F (36.7 C)-98.1 F (36.7 C)] 98.1 F (36.7 C) (10/22 0533) Pulse Rate:  [63-71] 63 (10/22 0533) Resp:  [18] 18 (10/22 0533) BP: (118)/(64-67) 118/64 mmHg (10/22 0533) Gen: A&Ox3, NAD CV: RRR, no MRG Resp: CTAB Abdomen: soft, NT, ND Uterus: firm, non-tender, below umbilicus Ext: Minimal edema, no calf tenderness bilaterally  Labs:   Recent Labs  05/19/14 0748 05/20/14 0540  HGB 13.6 12.6    A/P: Pt is a 33 y.o. [id number] s/p NSVD, PPD#2  - Pain well controlled -GU: UOP is adequate -GI: Tolerating general diet -Activity: encouraged sitting up to chair and ambulation as tolerated -Prophylaxis: early ambulation -Labs: stable as above  Dispo: Plan for discharge home today  Myna HidalgoJennifer Xan Sparkman, DO [phone] (pager) [phone] (office)

## 2014-05-21 NOTE — Discharge Summary (Signed)
Obstetric Discharge Summary Reason for Admission: onset of labor Prenatal Procedures: none Intrapartum Procedures: spontaneous vaginal delivery Postpartum Procedures: none Complications-Operative and Postpartum: 2nd degree perineal laceration Date of Admission: 05/19/14 Date of Discharge: 05/21/14  Hemoglobin  Date Value Ref Range Status  05/20/2014 12.6  12.0 - 15.0 g/dL Final     HCT  Date Value Ref Range Status  05/20/2014 36.5  36.0 - 46.0 % Final    Physical Exam:  O: Temp: [98 F (36.7 C)-98.1 F (36.7 C)] 98.1 F (36.7 C) (10/22 0533)  Pulse Rate: [63-71] 63 (10/22 0533)  Resp: [18] 18 (10/22 0533)  BP: (118)/(64-67) 118/64 mmHg (10/22 0533)  Gen: A&Ox3, NAD  CV: RRR, no MRG  Resp: CTAB  Abdomen: soft, NT, ND  Uterus: firm, non-tender, below umbilicus  Ext: Minimal edema, no calf tenderness bilaterally   Discharge Diagnoses: Term Pregnancy-delivered  Hospital Course: Christine Moyer is a 33 y.o. female presenting for G4 P1021 @ 39 6/7 weeks presenting with c/o ROM. Clear fluid noted starting at 0230. Contractions started after rupture. Cervix was 1-2 cm in office 5 days ago and was found to be 3cm on arrival.  Pregnancy uncomplicated to date.     The patient was managed expectantly and progressed to complete dilation. At 11:54 AM on 05/19/14, a viable female was delivered via Vaginal, Spontaneous Delivery (Presentation: ; Occiput Anterior)- delivery performed by Dr. Simona Huh.  APGAR: 7, 8; weight 6#50z.  Tight nuchal cord reduced, placenta delivered spontaneously.  2nd degree laceration noted and repaired in the normal fashion.  Her postpartum course was uncomplicated as she met all milestones appropriately.  She was discharged home in stable condition on PPD#2.  Discharge Information: Date: 05/21/2014 Activity: pelvic rest Diet: routine Medications: PNV and Ibuprofen Condition: stable Instructions: refer to practice specific booklet Discharge to:  home Follow-up Information   Follow up with Janyth Pupa, M, DO In 6 weeks.   Specialty:  Obstetrics and Gynecology   Contact information:   [address] [place] [address] [phone]       Newborn Data: Live born female  Birth Weight: 6 lb 5.2 oz (2870 g) APGAR: 7, 8  Home with mother.  Janyth Pupa, M 05/21/2014, 9:12 PM

## 2014-06-01 ENCOUNTER — Encounter (HOSPITAL_COMMUNITY): Payer: Self-pay | Admitting: *Deleted

## 2014-12-25 ENCOUNTER — Other Ambulatory Visit (INDEPENDENT_AMBULATORY_CARE_PROVIDER_SITE_OTHER): Payer: BLUE CROSS/BLUE SHIELD

## 2014-12-25 ENCOUNTER — Other Ambulatory Visit: Payer: Self-pay | Admitting: Family Medicine

## 2014-12-25 DIAGNOSIS — D72829 Elevated white blood cell count, unspecified: Secondary | ICD-10-CM

## 2014-12-25 DIAGNOSIS — Z131 Encounter for screening for diabetes mellitus: Secondary | ICD-10-CM

## 2014-12-25 LAB — CBC WITH DIFFERENTIAL/PLATELET
BASOS ABS: 0 10*3/uL (ref 0.0–0.1)
Basophils Relative: 0.3 % (ref 0.0–3.0)
Eosinophils Absolute: 0.2 10*3/uL (ref 0.0–0.7)
Eosinophils Relative: 1.4 % (ref 0.0–5.0)
HCT: 41.6 % (ref 36.0–46.0)
Hemoglobin: 13.8 g/dL (ref 12.0–15.0)
LYMPHS PCT: 16.4 % (ref 12.0–46.0)
Lymphs Abs: 1.8 10*3/uL (ref 0.7–4.0)
MCHC: 33.2 g/dL (ref 30.0–36.0)
MCV: 84.2 fl (ref 78.0–100.0)
Monocytes Absolute: 0.8 10*3/uL (ref 0.1–1.0)
Monocytes Relative: 7.8 % (ref 3.0–12.0)
NEUTROS PCT: 74.1 % (ref 43.0–77.0)
Neutro Abs: 8 10*3/uL — ABNORMAL HIGH (ref 1.4–7.7)
PLATELETS: 196 10*3/uL (ref 150.0–400.0)
RBC: 4.94 Mil/uL (ref 3.87–5.11)
RDW: 13 % (ref 11.5–15.5)
WBC: 10.8 10*3/uL — ABNORMAL HIGH (ref 4.0–10.5)

## 2014-12-25 LAB — BASIC METABOLIC PANEL
BUN: 12 mg/dL (ref 6–23)
CALCIUM: 9.7 mg/dL (ref 8.4–10.5)
CO2: 29 mEq/L (ref 19–32)
CREATININE: 0.79 mg/dL (ref 0.40–1.20)
Chloride: 103 mEq/L (ref 96–112)
GFR: 89.38 mL/min (ref >60.00)
Glucose, Bld: 79 mg/dL (ref 70–99)
Potassium: 4.3 mEq/L (ref 3.5–5.1)
SODIUM: 139 meq/L (ref 135–145)

## 2014-12-25 LAB — TSH: TSH: 1.06 u[IU]/mL (ref 0.35–4.50)

## 2014-12-31 ENCOUNTER — Encounter: Payer: Self-pay | Admitting: Family Medicine

## 2014-12-31 ENCOUNTER — Ambulatory Visit (INDEPENDENT_AMBULATORY_CARE_PROVIDER_SITE_OTHER): Payer: BLUE CROSS/BLUE SHIELD | Admitting: Family Medicine

## 2014-12-31 VITALS — BP 100/60 | HR 64 | Temp 98.1°F | Ht 68.5 in | Wt 156.8 lb

## 2014-12-31 DIAGNOSIS — Z0000 Encounter for general adult medical examination without abnormal findings: Secondary | ICD-10-CM

## 2014-12-31 DIAGNOSIS — J029 Acute pharyngitis, unspecified: Secondary | ICD-10-CM | POA: Diagnosis not present

## 2014-12-31 NOTE — Progress Notes (Signed)
BP 100/60 mmHg  Pulse 64  Temp(Src) 98.1 F (36.7 C) (Oral)  Ht 5' 8.5" (1.74 m)  Wt 156 lb 12 oz (71.101 kg)  BMI 23.48 kg/m2  Breastfeeding? Yes   CC: CPE  Subjective:    Patient ID: Christine Moyer, female    DOB: 01/25/1981, 34 y.o.   MRN: [id number]  HPI: Christine Moyer is a 34 y.o. female presenting on 12/31/2014 for Annual Exam   Several new moles/skin tags. Has had moles removed previously, never abnormal. No fmhx melanoma.  fmhx hep C (Father). Requests liver levels next year.  Preventative: Well woman - OBGYN Dr. Era Bumperszan [facility] 06/2014.  G5P2 Flu shot - did not have Tdap 2015 Seat belt use discussed. Sunscreen use discussed. No changing moles on skin.  Lives with husband and daughter and cat Occupation: Furniture conservator/restoreraudit analyst Edu: Masters in international economy Activity: gym/yoga 1-2 x/wk Diet: good water, fruits/vegetables daily  Relevant past medical, surgical, family and social history reviewed and updated as indicated. Interim medical history since our last visit reviewed. Allergies and medications reviewed and updated. No current outpatient prescriptions on file prior to visit.   No current facility-administered medications on file prior to visit.    Review of Systems  Constitutional: Negative for fever, chills, activity change, appetite change, fatigue and unexpected weight change.  HENT: Positive for sore throat (intermittent). Negative for hearing loss.   Eyes: Negative for visual disturbance.  Respiratory: Negative for cough, chest tightness, shortness of breath and wheezing.   Cardiovascular: Negative for chest pain, palpitations and leg swelling.  Gastrointestinal: Negative for nausea, vomiting, abdominal pain, diarrhea, constipation, blood in stool and abdominal distention.  Genitourinary: Negative for hematuria and difficulty urinating.  Musculoskeletal: Negative for myalgias, arthralgias and neck pain.  Skin: Negative for rash.  Neurological:  Negative for dizziness, seizures, syncope and headaches.  Hematological: Negative for adenopathy. Does not bruise/bleed easily.  Psychiatric/Behavioral: Negative for dysphoric mood. The patient is not nervous/anxious.    Per HPI unless specifically indicated above     Objective:    BP 100/60 mmHg  Pulse 64  Temp(Src) 98.1 F (36.7 C) (Oral)  Ht 5' 8.5" (1.74 m)  Wt 156 lb 12 oz (71.101 kg)  BMI 23.48 kg/m2  Breastfeeding? Yes  Wt Readings from Last 3 Encounters:  12/31/14 156 lb 12 oz (71.101 kg)  05/19/14 186 lb (84.369 kg)  10/09/13 149 lb 9.6 oz (67.858 kg)    Physical Exam  Constitutional: She is oriented to person, place, and time. She appears well-developed and well-nourished. No distress.  HENT:  Head: Normocephalic and atraumatic.  Right Ear: Hearing, tympanic membrane, external ear and ear canal normal.  Left Ear: Hearing, tympanic membrane, external ear and ear canal normal.  Nose: Nose normal.  Mouth/Throat: Uvula is midline, oropharynx is clear and moist and mucous membranes are normal. No oropharyngeal exudate, posterior oropharyngeal edema or posterior oropharyngeal erythema.  Posterior oropharyngeal cobblestoning  Eyes: Conjunctivae and EOM are normal. Pupils are equal, round, and reactive to light. No scleral icterus.  Neck: Normal range of motion. Neck supple. No thyromegaly present.  Cardiovascular: Normal rate, regular rhythm, normal heart sounds and intact distal pulses.   No murmur heard. Pulses:      Radial pulses are 2+ on the right side, and 2+ on the left side.  Pulmonary/Chest: Effort normal and breath sounds normal. No respiratory distress. She has no wheezes. She has no rales.  Abdominal: Soft. Bowel sounds are normal. She exhibits no distension and  no mass. There is no tenderness. There is no rebound and no guarding.  Musculoskeletal: Normal range of motion. She exhibits no edema.  Lymphadenopathy:    She has no cervical adenopathy.    Neurological: She is alert and oriented to person, place, and time.  CN grossly intact, station and gait intact  Skin: Skin is warm and dry. No rash noted.  Several typical moles throughout trunk  Psychiatric: She has a normal mood and affect. Her behavior is normal. Judgment and thought content normal.  Nursing note and vitals reviewed.  Results for orders placed or performed in visit on 12/25/14  CBC with Differential/Platelet  Result Value Ref Range   WBC 10.8 (H) 4.0 - 10.5 K/uL   RBC 4.94 3.87 - 5.11 Mil/uL   Hemoglobin 13.8 12.0 - 15.0 g/dL   HCT 21.3 08.6 - 57.8 %   MCV 84.2 78.0 - 100.0 fl   MCHC 33.2 30.0 - 36.0 g/dL   RDW 46.9 62.9 - 52.8 %   Platelets 196.0 150.0 - 400.0 K/uL   Neutrophils Relative % 74.1 43.0 - 77.0 %   Lymphocytes Relative 16.4 12.0 - 46.0 %   Monocytes Relative 7.8 3.0 - 12.0 %   Eosinophils Relative 1.4 0.0 - 5.0 %   Basophils Relative 0.3 0.0 - 3.0 %   Neutro Abs 8.0 (H) 1.4 - 7.7 K/uL   Lymphs Abs 1.8 0.7 - 4.0 K/uL   Monocytes Absolute 0.8 0.1 - 1.0 K/uL   Eosinophils Absolute 0.2 0.0 - 0.7 K/uL   Basophils Absolute 0.0 0.0 - 0.1 K/uL  Basic metabolic panel  Result Value Ref Range   Sodium 139 135 - 145 mEq/L   Potassium 4.3 3.5 - 5.1 mEq/L   Chloride 103 96 - 112 mEq/L   CO2 29 19 - 32 mEq/L   Glucose, Bld 79 70 - 99 mg/dL   BUN 12 6 - 23 mg/dL   Creatinine, Ser 4.13 0.40 - 1.20 mg/dL   Calcium 9.7 8.4 - 24.4 mg/dL   GFR 01.02 >72.53 mL/min  TSH  Result Value Ref Range   TSH 1.06 0.35 - 4.50 uIU/mL      Assessment & Plan:   Problem List Items Addressed This Visit    Health care maintenance - Primary    Preventative protocols reviewed and updated unless pt declined. Discussed healthy diet and lifestyle.       Sore throat    Anticipate allergic - discussed nasal saline irrigation, herbal tea, honey with lemon.          Follow up plan: Return in about 1 year (around 12/31/2015), or as needed, for annual exam, prior fasting  for blood work.

## 2014-12-31 NOTE — Assessment & Plan Note (Signed)
Anticipate allergic - discussed nasal saline irrigation, herbal tea, honey with lemon.

## 2014-12-31 NOTE — Patient Instructions (Signed)
Good to see you today, call us with quesitons. Return as needed or in 1 year for next physical.  Health Maintenance Adopting a healthy lifestyle and getting preventive care can go a long way to promote health and wellness. Talk with your health care provider about what schedule of regular examinations is right for you. This is a good chance for you to check in with your provider about disease prevention and staying healthy. In between checkups, there are plenty of things you can do on your own. Experts have done a lot of research about which lifestyle changes and preventive measures are most likely to keep you healthy. Ask your health care provider for more information. WEIGHT AND DIET  Eat a healthy diet  Be sure to include plenty of vegetables, fruits, low-fat dairy products, and lean protein.  Do not eat a lot of foods high in solid fats, added sugars, or salt.  Get regular exercise. This is one of the most important things you can do for your health.  Most adults should exercise for at least 150 minutes each week. The exercise should increase your heart rate and make you sweat (moderate-intensity exercise).  Most adults should also do strengthening exercises at least twice a week. This is in addition to the moderate-intensity exercise.  Maintain a healthy weight  Body mass index (BMI) is a measurement that can be used to identify possible weight problems. It estimates body fat based on height and weight. Your health care provider can help determine your BMI and help you achieve or maintain a healthy weight.  For females 67 years of age and older:   A BMI below 18.5 is considered underweight.  A BMI of 18.5 to 24.9 is normal.  A BMI of 25 to 29.9 is considered overweight.  A BMI of 30 and above is considered obese.  Watch levels of cholesterol and blood lipids  You should start having your blood tested for lipids and cholesterol at 33 years of age, then have this test every 5  years.  You may need to have your cholesterol levels checked more often if:  Your lipid or cholesterol levels are high.  You are older than 33 years of age.  You are at high risk for heart disease.  CANCER SCREENING   Lung Cancer  Lung cancer screening is recommended for adults 26-54 years old who are at high risk for lung cancer because of a history of smoking.  A yearly low-dose CT scan of the lungs is recommended for people who:  Currently smoke.  Have quit within the past 15 years.  Have at least a 30-pack-year history of smoking. A pack year is smoking an average of one pack of cigarettes a day for 1 year.  Yearly screening should continue until it has been 15 years since you quit.  Yearly screening should stop if you develop a health problem that would prevent you from having lung cancer treatment.  Breast Cancer  Practice breast self-awareness. This means understanding how your breasts normally appear and feel.  It also means doing regular breast self-exams. Let your health care provider know about any changes, no matter how small.  If you are in your 20s or 30s, you should have a clinical breast exam (CBE) by a health care provider every 1-3 years as part of a regular health exam.  If you are 77 or older, have a CBE every year. Also consider having a breast X-ray (mammogram) every year.  If you  have a family history of breast cancer, talk to your health care provider about genetic screening.  If you are at high risk for breast cancer, talk to your health care provider about having an MRI and a mammogram every year.  Breast cancer gene (BRCA) assessment is recommended for women who have family members with BRCA-related cancers. BRCA-related cancers include:  Breast.  Ovarian.  Tubal.  Peritoneal cancers.  Results of the assessment will determine the need for genetic counseling and BRCA1 and BRCA2 testing. Cervical Cancer Routine pelvic examinations to  screen for cervical cancer are no longer recommended for nonpregnant women who are considered low risk for cancer of the pelvic organs (ovaries, uterus, and vagina) and who do not have symptoms. A pelvic examination may be necessary if you have symptoms including those associated with pelvic infections. Ask your health care provider if a screening pelvic exam is right for you.   The Pap test is the screening test for cervical cancer for women who are considered at risk.  If you had a hysterectomy for a problem that was not cancer or a condition that could lead to cancer, then you no longer need Pap tests.  If you are older than 65 years, and you have had normal Pap tests for the past 10 years, you no longer need to have Pap tests.  If you have had past treatment for cervical cancer or a condition that could lead to cancer, you need Pap tests and screening for cancer for at least 20 years after your treatment.  If you no longer get a Pap test, assess your risk factors if they change (such as having a new sexual partner). This can affect whether you should start being screened again.  Some women have medical problems that increase their chance of getting cervical cancer. If this is the case for you, your health care provider may recommend more frequent screening and Pap tests.  The human papillomavirus (HPV) test is another test that may be used for cervical cancer screening. The HPV test looks for the virus that can cause cell changes in the cervix. The cells collected during the Pap test can be tested for HPV.  The HPV test can be used to screen women 66 years of age and older. Getting tested for HPV can extend the interval between normal Pap tests from three to five years.  An HPV test also should be used to screen women of any age who have unclear Pap test results.  After 33 years of age, women should have HPV testing as often as Pap tests.  Colorectal Cancer  This type of cancer can be  detected and often prevented.  Routine colorectal cancer screening usually begins at 33 years of age and continues through 33 years of age.  Your health care provider may recommend screening at an earlier age if you have risk factors for colon cancer.  Your health care provider may also recommend using home test kits to check for hidden blood in the stool.  A small camera at the end of a tube can be used to examine your colon directly (sigmoidoscopy or colonoscopy). This is done to check for the earliest forms of colorectal cancer.  Routine screening usually begins at age 25.  Direct examination of the colon should be repeated every 5-10 years through 33 years of age. However, you may need to be screened more often if early forms of precancerous polyps or small growths are found. Skin Cancer  Check  your skin from head to toe regularly.  Tell your health care provider about any new moles or changes in moles, especially if there is a change in a mole's shape or color.  Also tell your health care provider if you have a mole that is larger than the size of a pencil eraser.  Always use sunscreen. Apply sunscreen liberally and repeatedly throughout the day.  Protect yourself by wearing long sleeves, pants, a wide-brimmed hat, and sunglasses whenever you are outside. HEART DISEASE, DIABETES, AND HIGH BLOOD PRESSURE   Have your blood pressure checked at least every 1-2 years. High blood pressure causes heart disease and increases the risk of stroke.  If you are between 34 years and 60 years old, ask your health care provider if you should take aspirin to prevent strokes.  Have regular diabetes screenings. This involves taking a blood sample to check your fasting blood sugar level.  If you are at a normal weight and have a low risk for diabetes, have this test once every three years after 33 years of age.  If you are overweight and have a high risk for diabetes, consider being tested at a  younger age or more often. PREVENTING INFECTION  Hepatitis B  If you have a higher risk for hepatitis B, you should be screened for this virus. You are considered at high risk for hepatitis B if:  You were born in a country where hepatitis B is common. Ask your health care provider which countries are considered high risk.  Your parents were born in a high-risk country, and you have not been immunized against hepatitis B (hepatitis B vaccine).  You have HIV or AIDS.  You use needles to inject street drugs.  You live with someone who has hepatitis B.  You have had sex with someone who has hepatitis B.  You get hemodialysis treatment.  You take certain medicines for conditions, including cancer, organ transplantation, and autoimmune conditions. Hepatitis C  Blood testing is recommended for:  Everyone born from [birth year] through 1964.  Anyone with known risk factors for hepatitis C. Sexually transmitted infections (STIs)  You should be screened for sexually transmitted infections (STIs) including gonorrhea and chlamydia if:  You are sexually active and are younger than 33 years of age.  You are older than 33 years of age and your health care provider tells you that you are at risk for this type of infection.  Your sexual activity has changed since you were last screened and you are at an increased risk for chlamydia or gonorrhea. Ask your health care provider if you are at risk.  If you do not have HIV, but are at risk, it may be recommended that you take a prescription medicine daily to prevent HIV infection. This is called pre-exposure prophylaxis (PrEP). You are considered at risk if:  You are sexually active and do not regularly use condoms or know the HIV status of your partner(s).  You take drugs by injection.  You are sexually active with a partner who has HIV. Talk with your health care provider about whether you are at high risk of being infected with HIV. If you choose  to begin PrEP, you should first be tested for HIV. You should then be tested every 3 months for as long as you are taking PrEP.  PREGNANCY   If you are premenopausal and you may become pregnant, ask your health care provider about preconception counseling.  If you may become pregnant, take  400 to 800 micrograms (mcg) of folic acid every day.  If you want to prevent pregnancy, talk to your health care provider about birth control (contraception). OSTEOPOROSIS AND MENOPAUSE   Osteoporosis is a disease in which the bones lose minerals and strength with aging. This can result in serious bone fractures. Your risk for osteoporosis can be identified using a bone density scan.  If you are 23 years of age or older, or if you are at risk for osteoporosis and fractures, ask your health care provider if you should be screened.  Ask your health care provider whether you should take a calcium or vitamin D supplement to lower your risk for osteoporosis.  Menopause may have certain physical symptoms and risks.  Hormone replacement therapy may reduce some of these symptoms and risks. Talk to your health care provider about whether hormone replacement therapy is right for you.  HOME CARE INSTRUCTIONS   Schedule regular health, dental, and eye exams.  Stay current with your immunizations.   Do not use any tobacco products including cigarettes, chewing tobacco, or electronic cigarettes.  If you are pregnant, do not drink alcohol.  If you are breastfeeding, limit how much and how often you drink alcohol.  Limit alcohol intake to no more than 1 drink per day for nonpregnant women. One drink equals 12 ounces of beer, 5 ounces of wine, or 1 ounces of hard liquor.  Do not use street drugs.  Do not share needles.  Ask your health care provider for help if you need support or information about quitting drugs.  Tell your health care provider if you often feel depressed.  Tell your health care  provider if you have ever been abused or do not feel safe at home. Document Released: 01/30/2011 Document Revised: 12/01/2013 Document Reviewed: 06/18/2013 [facility] Patient Information 2015 [place], [place]. This information is not intended to replace advice given to you by your health care provider. Make sure you discuss any questions you have with your health care provider.

## 2014-12-31 NOTE — Progress Notes (Signed)
Pre visit review using our clinic review tool, if applicable. No additional management support is needed unless otherwise documented below in the visit note. 

## 2014-12-31 NOTE — Assessment & Plan Note (Signed)
Preventative protocols reviewed and updated unless pt declined. Discussed healthy diet and lifestyle.  

## 2015-01-13 ENCOUNTER — Telehealth: Payer: Self-pay | Admitting: Family Medicine

## 2015-01-13 NOTE — Telephone Encounter (Signed)
She needs to be evaluated again in light of not improving  UC or first avail I will copy her PCP

## 2015-01-13 NOTE — Telephone Encounter (Signed)
Patient Name: Christine Moyer DOB: 10-28-1980 Initial Comment Caller states she was diagnosed with strep on Sun, prescribed abx 2x daily, but still has fever, sore throat, headache, chills and night sweat Nurse Assessment Nurse: Elijah Birk, RN, Stark Bray Date/Time (Eastern Time): 01/13/2015 2:47:16 PM Confirm and document reason for call. If symptomatic, describe symptoms. ---Caller states she was diagnosed with strep on Sunday, prescribed antibiotics twice daily - Cefdinir 300 mg, but still has fever 101-104, sore throat, headache, chills and night sweat. Taking Advil as well. Has the patient traveled out of the country within the last 30 days? ---Not Applicable Does the patient require triage? ---Yes Related visit to physician within the last 2 weeks? ---Yes Does the PT have any chronic conditions? (i.e. diabetes, asthma, etc.) ---No Did the patient indicate they were pregnant? ---No Guidelines Guideline Title Affirmed Question Affirmed Notes Strep Throat Infection on Antibiotic Followup Call [1] Fever > 103 F (39.4 C) AND [2] took antibiotic > 24 hours Final Disposition User Go to ED Now (or PCP triage) Elijah Birk, RN, Lynda Comments Caller states she mainly has fever during the day, at night, night sweats. She will return to the [facility] she was seen at on Sunday. She has been on the antibiotic since Sunday.

## 2015-01-13 NOTE — Telephone Encounter (Signed)
Agree with return to [facility] or eval in office. plz call for update later today or tomorrow. Don't agree with ER evaluation.

## 2015-01-13 NOTE — Telephone Encounter (Signed)
Called patient. She just came back from urgent care. She was given antibiotics. Will call if no improvement.

## 2015-06-12 IMAGING — US US OB TRANSVAGINAL
1 series · 14 of 28 positions shown · non-contrast
Comparison: None.

CLINICAL DATA: Pregnant, bleeding, beta HCG pending

EXAM:
OBSTETRIC <14 WK US AND TRANSVAGINAL OB US
TECHNIQUE: Both transabdominal and transvaginal ultrasound examinations were
performed for complete evaluation of the gestation as well as the
maternal uterus, adnexal regions, and pelvic cul-de-sac.
Transvaginal technique was performed to assess early pregnancy.

[Series 1: us ob comp less 14 wks · 44 acquisitions, 14 frames shown]
[im 2/44]
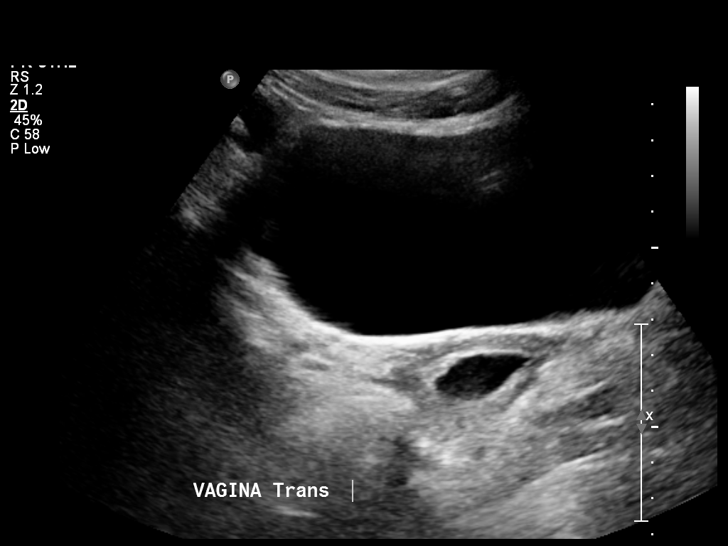
[im 5/44]
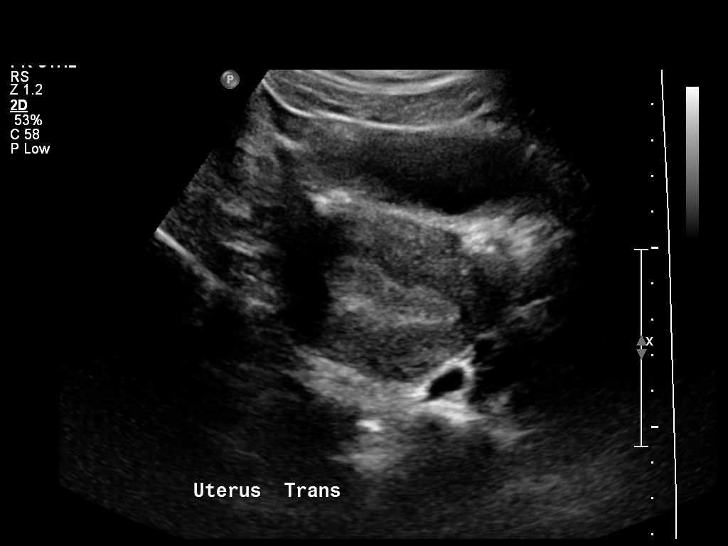
[im 8/44]
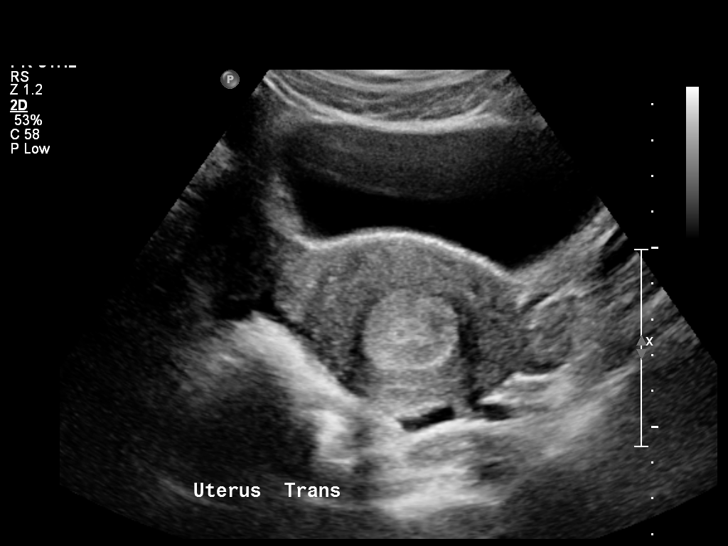
[im 12/44]
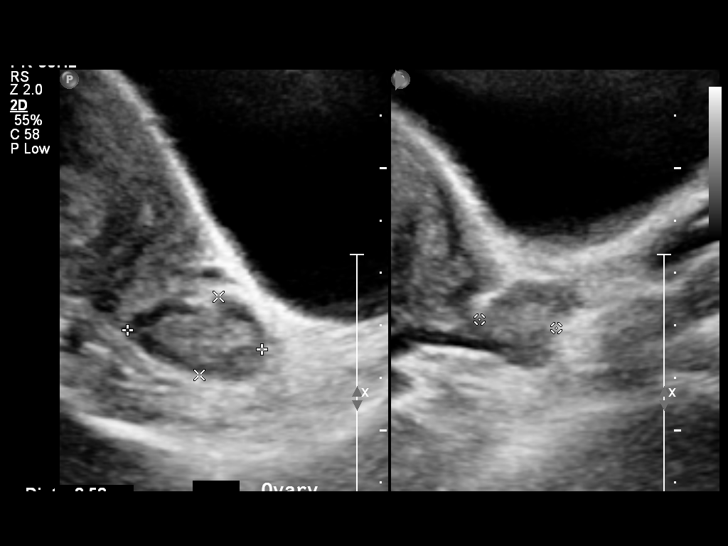
[im 15/44]
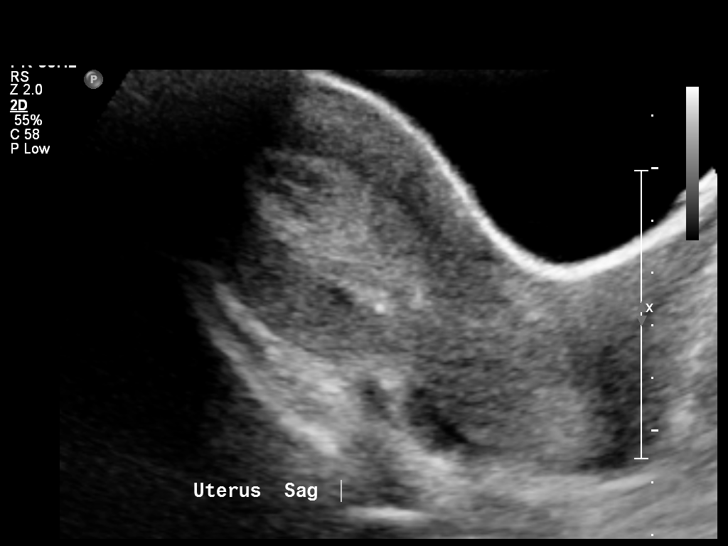
[im 18/44]
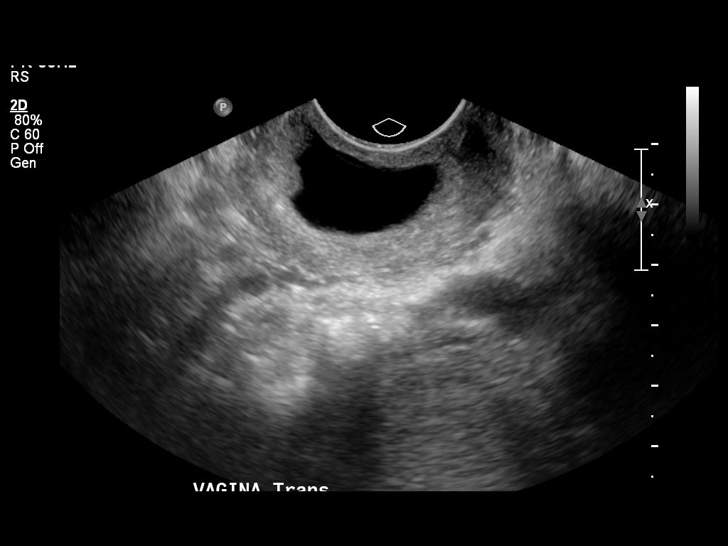
[im 21/44]
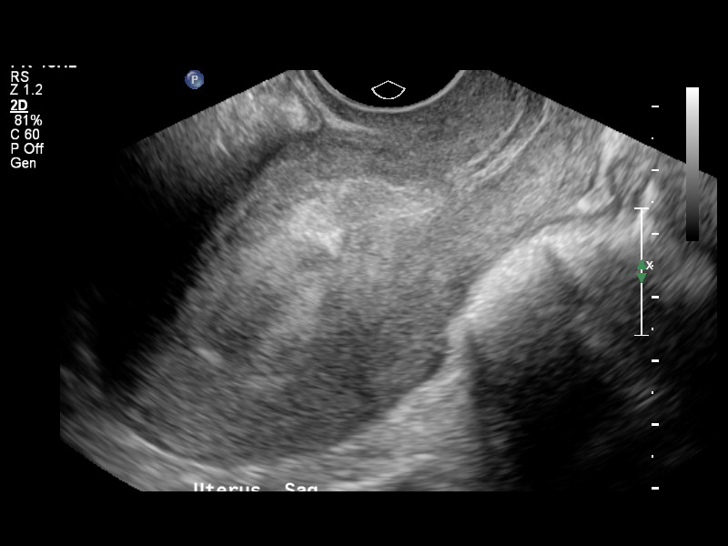
[im 24/44]
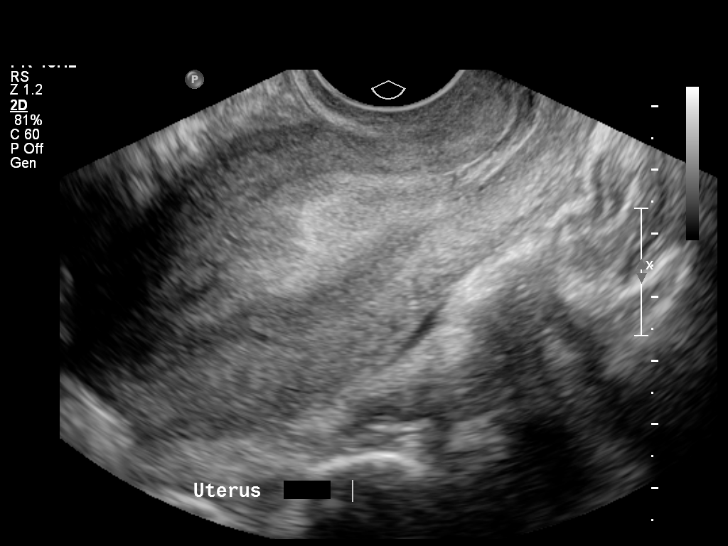
[im 28/44]
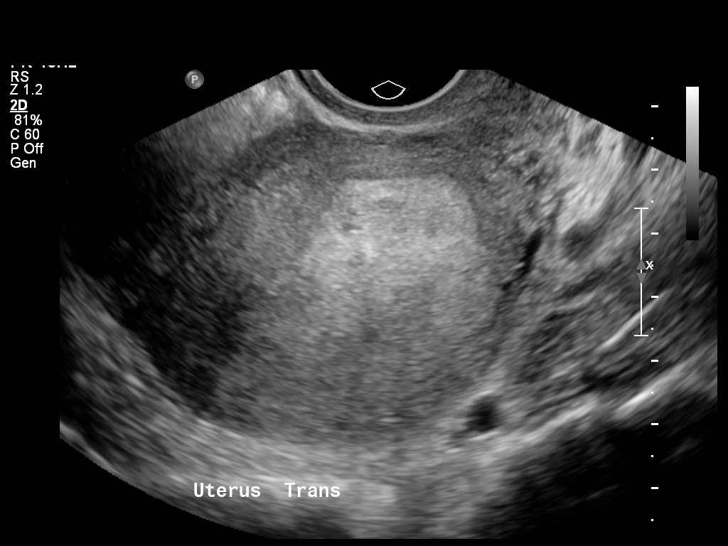
[im 31/44]
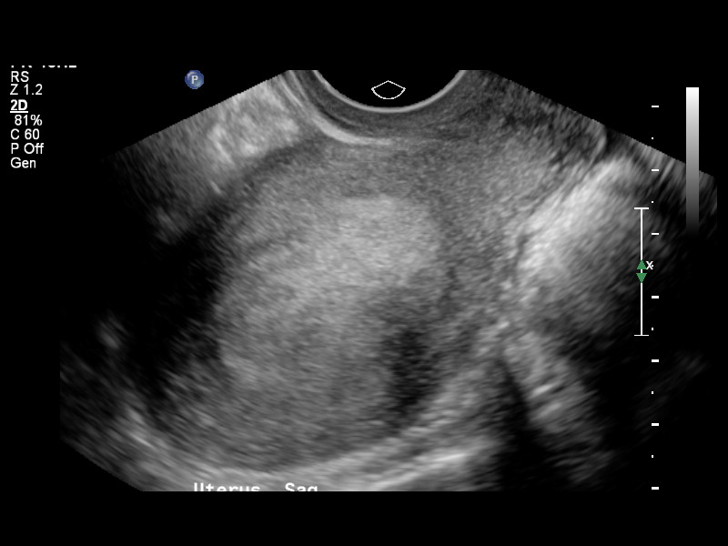
[im 34/44]
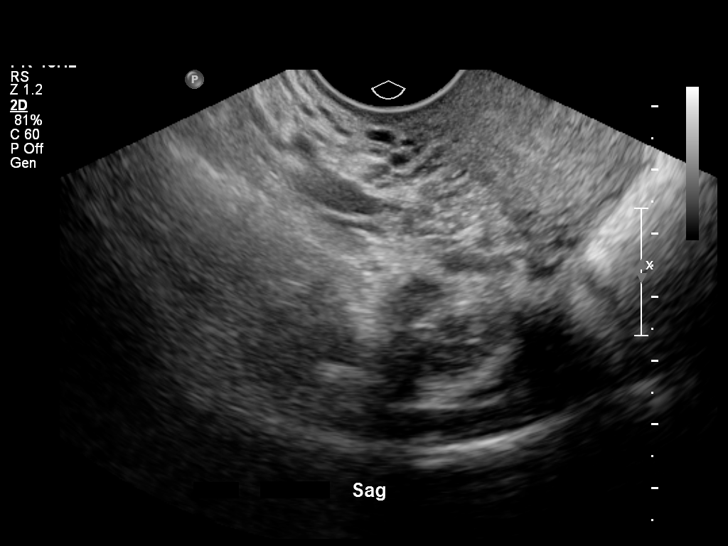
[im 37/44]
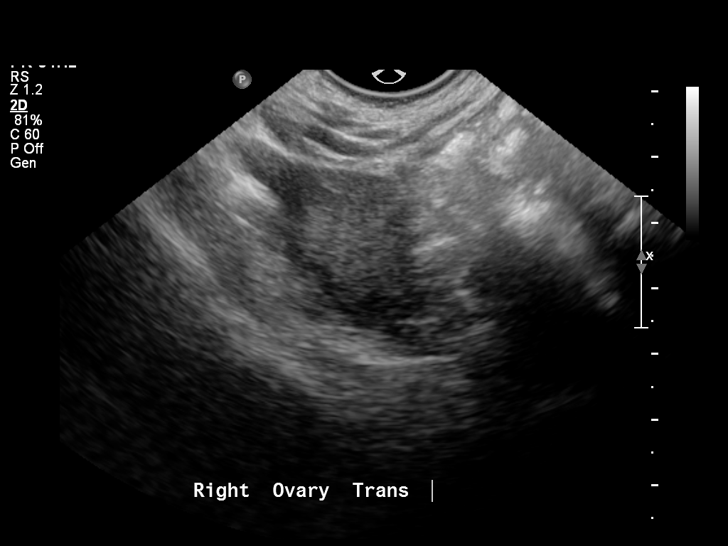
[im 40/44]
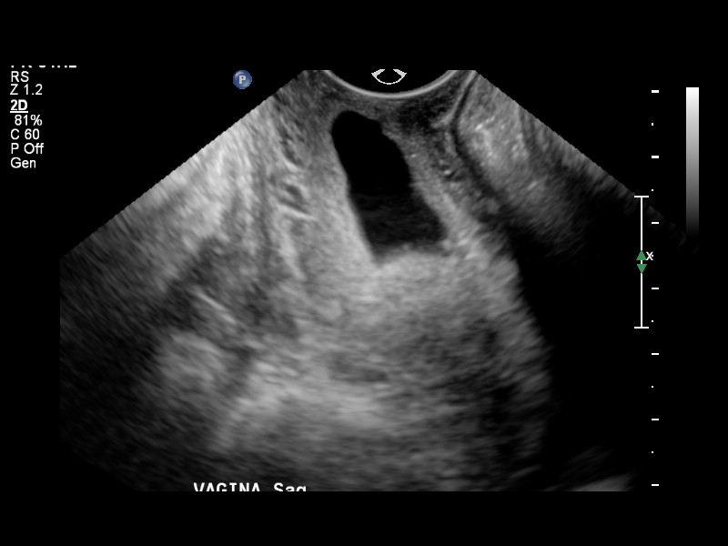
[im 44/44]
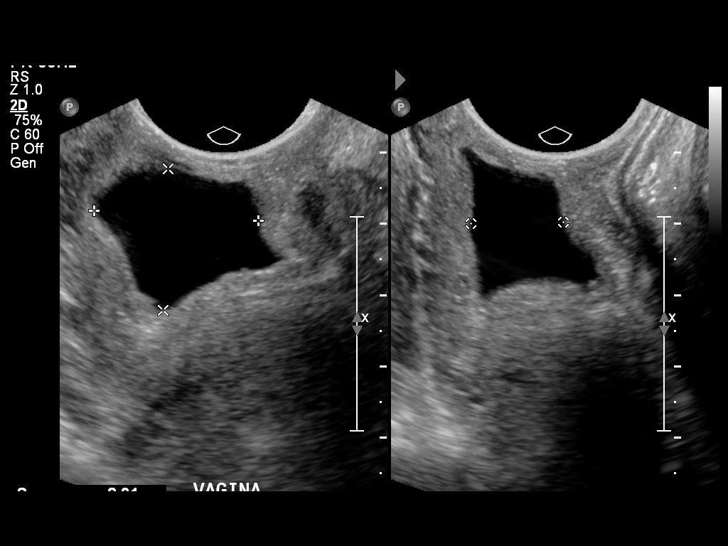

[14 of 28 positions shown; findings below may reference images not displayed]

FINDINGS: Intrauterine gestational sac:  Not visualized

Maternal uterus/adnexae: Endometrium is thickened/heterogeneous,
measuring 2.6 cm in thickness.

Right ovary are within normal limits, measuring 2.4 x 1.8 x 2.1 cm.

Left ovary is only visualized transabdominally but is grossly
unremarkable, measuring 2.6 x 1.5 x 1.5 cm.

Fluid/irregular cystic lesion within the vagina.

Trace free fluid.
IMPRESSION: No intrauterine gestation is visualized. No adnexal mass is seen.
Correlate with beta HCG (pending).

Endometrial complex is thickened/heterogeneous, measuring 2.6 cm.

Fluid/irregular cystic lesion within the vagina. Clinical
correlation required.

## 2015-08-04 ENCOUNTER — Telehealth: Payer: Self-pay | Admitting: Family Medicine

## 2015-08-04 NOTE — Telephone Encounter (Signed)
Patient would like for the doctor to give her a call concerning some sexual health issues.

## 2015-08-05 NOTE — Telephone Encounter (Signed)
spoke with wife regarding concerns about husband - she notes decreased libido, energy levels and anhedonia. Will touch base at husband's appointment.

## 2015-09-07 ENCOUNTER — Telehealth: Payer: Self-pay | Admitting: Family Medicine

## 2015-09-10 NOTE — Telephone Encounter (Signed)
Opened in error

## 2015-12-27 ENCOUNTER — Other Ambulatory Visit: Payer: Self-pay | Admitting: Family Medicine

## 2015-12-27 DIAGNOSIS — D72829 Elevated white blood cell count, unspecified: Secondary | ICD-10-CM

## 2015-12-27 DIAGNOSIS — K769 Liver disease, unspecified: Secondary | ICD-10-CM

## 2015-12-27 DIAGNOSIS — Z131 Encounter for screening for diabetes mellitus: Secondary | ICD-10-CM

## 2015-12-28 ENCOUNTER — Other Ambulatory Visit: Payer: BLUE CROSS/BLUE SHIELD

## 2016-01-03 ENCOUNTER — Encounter: Payer: BLUE CROSS/BLUE SHIELD | Admitting: Family Medicine

## 2016-01-10 ENCOUNTER — Encounter: Payer: BLUE CROSS/BLUE SHIELD | Admitting: Family Medicine

## 2016-01-13 ENCOUNTER — Encounter: Payer: BLUE CROSS/BLUE SHIELD | Admitting: Family Medicine
